# Patient Record
Sex: Female | Born: 1985 | Race: White | Hispanic: No | Marital: Married
Health system: Southern US, Community
[De-identification: ages and names within clinical notes are randomized; demographics above are authoritative.]

---

## 2007-03-24 ENCOUNTER — Inpatient Hospital Stay: Payer: Self-pay | Admitting: Obstetrics and Gynecology

## 2009-09-22 ENCOUNTER — Observation Stay: Payer: Self-pay

## 2009-09-26 ENCOUNTER — Inpatient Hospital Stay: Payer: Self-pay | Admitting: Obstetrics and Gynecology

## 2012-01-12 ENCOUNTER — Inpatient Hospital Stay: Payer: Self-pay

## 2012-01-12 LAB — CBC WITH DIFFERENTIAL/PLATELET
Basophil #: 0 10*3/uL (ref 0.0–0.1)
Basophil %: 0.3 %
HCT: 33.6 % — ABNORMAL LOW (ref 35.0–47.0)
HGB: 11.7 g/dL — ABNORMAL LOW (ref 12.0–16.0)
Lymphocyte #: 2 10*3/uL (ref 1.0–3.6)
MCH: 31.2 pg (ref 26.0–34.0)
MCHC: 34.7 g/dL (ref 32.0–36.0)
MCV: 90 fL (ref 80–100)
Monocyte #: 0.7 x10 3/mm (ref 0.2–0.9)
Monocyte %: 5.8 %
Neutrophil #: 8.9 10*3/uL — ABNORMAL HIGH (ref 1.4–6.5)
Platelet: 208 10*3/uL (ref 150–440)
RDW: 14.1 % (ref 11.5–14.5)
WBC: 12 10*3/uL — ABNORMAL HIGH (ref 3.6–11.0)

## 2012-01-13 LAB — HEMATOCRIT: HCT: 32.7 % — ABNORMAL LOW (ref 35.0–47.0)

## 2014-04-20 ENCOUNTER — Inpatient Hospital Stay: Payer: Self-pay

## 2014-04-20 LAB — CBC WITH DIFFERENTIAL/PLATELET
Basophil #: 0 10*3/uL (ref 0.0–0.1)
Basophil %: 0.3 %
EOS PCT: 0.6 %
Eosinophil #: 0.1 10*3/uL (ref 0.0–0.7)
HCT: 41.2 % (ref 35.0–47.0)
HGB: 13.9 g/dL (ref 12.0–16.0)
LYMPHS ABS: 1.6 10*3/uL (ref 1.0–3.6)
LYMPHS PCT: 9.5 %
MCH: 33.2 pg (ref 26.0–34.0)
MCHC: 33.8 g/dL (ref 32.0–36.0)
MCV: 98 fL (ref 80–100)
Monocyte #: 0.7 x10 3/mm (ref 0.2–0.9)
Monocyte %: 4 %
NEUTROS PCT: 85.6 %
Neutrophil #: 14.1 10*3/uL — ABNORMAL HIGH (ref 1.4–6.5)
PLATELETS: 200 10*3/uL (ref 150–440)
RBC: 4.19 10*6/uL (ref 3.80–5.20)
RDW: 13.8 % (ref 11.5–14.5)
WBC: 16.4 10*3/uL — ABNORMAL HIGH (ref 3.6–11.0)

## 2014-04-21 LAB — HEMATOCRIT: HCT: 38.3 % (ref 35.0–47.0)

## 2014-10-24 NOTE — H&P (Signed)
L&D Evaluation:  History:  HPI Pt is a 29 yo G5P3013 at 40.[redacted] weeks GA with an EDC of 04/16/14 whp presents to L&D with reports of contractions that started around 4am. She reports +Fm, denies vb or lof. SHe has had no complications this pregnancy. She is A+, VI, RI, GBS negative, and received her flu vaccine and Tdap this pregnancy.   Presents with contractions   Patient's Medical History Asthma   Patient's Surgical History none   Medications Pre [place]atal Vitamins   Allergies English walnuts, pecans, peanuts   Social History none   Family History ovarian cancer   ROS:  ROS All systems were reviewed.  HEENT, CNS, GI, GU, Respiratory, CV, Renal and Musculoskeletal systems were found to be normal.   Exam:  Vital Signs stable   General no apparent distress   Mental Status clear   Abdomen gravid, tender with contractions   Pelvic 5.5-6/100/-1-0 upon admission   Mebranes Intact   FHT normal rate with no decels, 150's, moderate variability, +accels, no decels   Ucx regular, every 2-4   Skin dry, no lesions, no rashes   Impression:  Impression active labor, reactive NST, IUP at 40.4   Plan:  Plan intermittent monitoring, anticipate svd   Comments pt declines IV at this time. Risks vs benefits discussed. Pt amendabled to IV if necessary or if she desires epidural analgesia.   Follow Up Appointment need to schedule. in 6 weeks   Electronic Signatures: Jannet MantisSubudhi, Demika Langenderfer (CNM)  (Signed [phone]-Nov-15 11:31)  Authored: L&D Evaluation   Last Updated: 05-Nov-15 11:31 by Jannet MantisSubudhi, Mychael Soots (CNM)

## 2014-10-24 NOTE — H&P (Signed)
L&D Evaluation:  History:   HPI 29 year old G3 P2002 with EDC=01/07/2012 based on a LMP=04/02/2011 presents at 40 5/7 weeks with strong regular contractions since 1100 today. +mucoid show over the weekend. Baby active. [facility] begun in first trimester and remarkable for a normal anatomy scan. LABS: A POS, VI, RI, GBS negative. SVD in 2008 7-12 female and in 2011 at 7941 weeks 8-5 female (CAN x2 and thick MSAF).    Presents with contractions    Patient's Medical History No Chronic Illness    Patient's Surgical History none    Allergies English Walnuts, Peanuts, pecans    Social History none    Family History Non-Contributory   ROS:   ROS All systems were reviewed.  HEENT, CNS, GI, GU, Respiratory, CV, Renal and Musculoskeletal systems were found to be normal.   Exam:   Vital Signs stable    General no apparent distress, breathing thru some contractions    Mental Status clear    Chest clear    Heart normal sinus rhythm, no murmur/gallop/rubs    Abdomen gravid, tender with contractions    Fetal Position cephalic    Edema no edema    Reflexes 2+    Pelvic no external lesions, 3-4/75-80%/-1    Mebranes Intact    FHT normal rate with no decels    Skin dry   Impression:   Impression IUP at 40 5/7 weeks in early active labor.   Plan:   Plan EFM/NST, monitor contractions and for cervical change, Desires epidural. Will AROM after epidural.   Electronic Signatures: Trinna BalloonGutierrez, Banner Huckaba L (CNM)  (Signed 29-Jul-13 16:58)  Authored: L&D Evaluation   Last Updated: 29-Jul-13 16:58 by Trinna BalloonGutierrez, Marialuiza Car L (CNM)

## 2016-09-05 ENCOUNTER — Other Ambulatory Visit (HOSPITAL_COMMUNITY): Payer: Self-pay

## 2016-09-05 DIAGNOSIS — Z3482 Encounter for supervision of other normal pregnancy, second trimester: Secondary | ICD-10-CM

## 2016-10-17 ENCOUNTER — Encounter (HOSPITAL_COMMUNITY): Payer: Self-pay

## 2016-10-17 ENCOUNTER — Ambulatory Visit (HOSPITAL_COMMUNITY)
Admission: RE | Admit: 2016-10-17 | Discharge: 2016-10-17 | Disposition: A | Discharge status: Home / Self Care | Payer: Self-pay | Source: Ambulatory Visit

## 2016-10-17 ENCOUNTER — Other Ambulatory Visit (HOSPITAL_COMMUNITY): Payer: Self-pay

## 2016-10-17 DIAGNOSIS — Z3689 Encounter for other specified antenatal screening: Secondary | ICD-10-CM | POA: Insufficient documentation

## 2016-10-17 DIAGNOSIS — Z3482 Encounter for supervision of other normal pregnancy, second trimester: Secondary | ICD-10-CM | POA: Insufficient documentation

## 2016-10-17 DIAGNOSIS — Z3A18 18 weeks gestation of pregnancy: Secondary | ICD-10-CM

## 2017-03-10 ENCOUNTER — Inpatient Hospital Stay (HOSPITAL_COMMUNITY): Admission: AD | Admit: 2017-03-10 | Payer: Self-pay | Source: Ambulatory Visit | Admitting: Obstetrics & Gynecology
